# Patient Record
Sex: Male | Born: 2000 | Race: Black or African American | Hispanic: No | Marital: Single | Smoking: Never smoker
Health system: Southern US, Community
[De-identification: ages and names within clinical notes are randomized; demographics above are authoritative.]

---
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
## 2019-08-20 ENCOUNTER — Encounter (HOSPITAL_COMMUNITY): Payer: Self-pay | Admitting: Emergency Medicine

## 2019-08-20 ENCOUNTER — Emergency Department (HOSPITAL_COMMUNITY)
Admission: EM | Admit: 2019-08-20 | Discharge: 2019-08-20 | Disposition: A | Discharge status: Home / Self Care | Payer: Medicaid Other | Attending: Pediatric Emergency Medicine | Admitting: Pediatric Emergency Medicine

## 2019-08-20 ENCOUNTER — Other Ambulatory Visit: Payer: Self-pay

## 2019-08-20 DIAGNOSIS — Z202 Contact with and (suspected) exposure to infections with a predominantly sexual mode of transmission: Secondary | ICD-10-CM | POA: Diagnosis not present

## 2019-08-20 DIAGNOSIS — R369 Urethral discharge, unspecified: Secondary | ICD-10-CM | POA: Diagnosis present

## 2019-08-20 DIAGNOSIS — Z79899 Other long term (current) drug therapy: Secondary | ICD-10-CM | POA: Diagnosis not present

## 2019-08-20 LAB — URINALYSIS, ROUTINE W REFLEX MICROSCOPIC
Bilirubin Urine: NEGATIVE
Glucose, UA: NEGATIVE mg/dL
Hgb urine dipstick: NEGATIVE
Ketones, ur: NEGATIVE mg/dL
Nitrite: NEGATIVE
Protein, ur: 30 mg/dL — AB
Specific Gravity, Urine: 1.024 (ref 1.005–1.030)
WBC, UA: >50 WBC/hpf — ABNORMAL HIGH (ref 0–5)
pH: 6 (ref 5.0–8.0)

## 2019-08-20 LAB — GC/CHLAMYDIA PROBE AMP (~~LOC~~) NOT AT ARMC
Chlamydia: POSITIVE — AB
Comment: NEGATIVE
Comment: NORMAL
Neisseria Gonorrhea: POSITIVE — AB

## 2019-08-20 MED ORDER — LIDOCAINE HCL (PF) 1 % IJ SOLN
INTRAMUSCULAR | Status: AC
  Administered 2019-08-20: 1 mL
  Filled 2019-08-20: qty 5

## 2019-08-20 MED ORDER — CEFTRIAXONE SODIUM 500 MG IJ SOLR
500.0000 mg | Freq: Once | INTRAMUSCULAR | Status: AC
  Administered 2019-08-20: 500 mg via INTRAMUSCULAR
  Filled 2019-08-20: qty 500

## 2019-08-20 MED ORDER — AZITHROMYCIN 250 MG PO TABS
1000.0000 mg | ORAL_TABLET | Freq: Once | ORAL | Status: AC
  Administered 2019-08-20: 06:00:00 1000 mg via ORAL
  Filled 2019-08-20: qty 4

## 2019-08-20 NOTE — ED Notes (Signed)
Urine culture sent to lab with urine 

## 2019-08-20 NOTE — ED Triage Notes (Signed)
Patient reports yellow penile discharge for 2 weeks , denies fever or chills .

## 2019-08-20 NOTE — ED Provider Notes (Signed)
[facility] EMERGENCY DEPARTMENT Provider Note   CSN: [id number] Arrival date & time: 08/20/19  8546     History Chief Complaint  Patient presents with  . Penile Discharge    Keith Sullivan is a 19 y.o. male with STD treatment 10 month prior with 2 week of discharge.  No fevers, vomiting, diarrhea.  No medications prior.    The history is provided by the patient.  Penile Discharge This is a new problem. The current episode started more than 1 week ago. The problem occurs constantly. The problem has been gradually worsening. Pertinent negatives include no abdominal pain and no shortness of breath. Nothing aggravates the symptoms. Nothing relieves the symptoms. He has tried nothing for the symptoms.       History reviewed. No pertinent past medical history.  Patient Active Problem List   Diagnosis Date Noted  . Severe recurrent major depression without psychotic features (HCC) 09/20/2017  . Cannabis use disorder, mild, abuse     History reviewed. No pertinent surgical history.     No family history on file.  Social History   Tobacco Use  . Smoking status: Never Smoker  . Smokeless tobacco: Never Used  Substance Use Topics  . Alcohol use: Yes    Comment: socially   . Drug use: Yes    Types: Marijuana    Home Medications Prior to Admission medications   Medication Sig Start Date End Date Taking? Authorizing Provider  FLUoxetine (PROZAC) 10 MG capsule Take 1 capsule (10 mg total) by mouth daily. 09/27/17   Starkes-Perry, Juel Burrow, FNP  hydrOXYzine (ATARAX/VISTARIL) 25 MG tablet Take 1 tablet (25 mg total) by mouth at bedtime as needed and may repeat dose one time if needed for anxiety (insomnia). 09/26/17   Maryagnes Amos, FNP    Allergies    Patient has no known allergies.  Review of Systems   Review of Systems  Respiratory: Negative for shortness of breath.   Gastrointestinal: Negative for abdominal pain.  Genitourinary: Positive for  discharge and dysuria. Negative for decreased urine volume.  Skin: Negative for rash.  All other systems reviewed and are negative.   Physical Exam Updated Vital Signs Ht 6' (1.829 m)   Wt 75 kg   BMI 22.42 kg/m   Physical Exam Vitals and nursing note reviewed.  Constitutional:      Appearance: He is well-developed.  HENT:     Head: Normocephalic and atraumatic.     Nose: No congestion or rhinorrhea.     Mouth/Throat:     Pharynx: No oropharyngeal exudate.  Eyes:     Extraocular Movements: Extraocular movements intact.     Conjunctiva/sclera: Conjunctivae normal.     Pupils: Pupils are equal, round, and reactive to light.  Cardiovascular:     Rate and Rhythm: Normal rate and regular rhythm.     Heart sounds: No murmur.  Pulmonary:     Effort: Pulmonary effort is normal. No respiratory distress.     Breath sounds: Normal breath sounds.  Abdominal:     Palpations: Abdomen is soft.     Tenderness: There is no abdominal tenderness.  Genitourinary:    Penis: Normal.      Testes: Normal.     Comments: Purulent discharge from meatus Musculoskeletal:     Cervical back: Neck supple.  Skin:    General: Skin is warm and dry.     Capillary Refill: Capillary refill takes less than 2 seconds.  Neurological:  General: No focal deficit present.     Mental Status: He is alert.     Gait: Gait normal.     Deep Tendon Reflexes: Reflexes normal.     ED Results / Procedures / Treatments   Labs (all labs ordered are listed, but only abnormal results are displayed) Labs Reviewed  URINALYSIS, ROUTINE W REFLEX MICROSCOPIC - Abnormal; Notable for the following components:      Result Value   APPearance CLOUDY (*)    Protein, ur 30 (*)    Leukocytes,Ua LARGE (*)    WBC, UA >50 (*)    Bacteria, UA RARE (*)    All other components within normal limits  GC/CHLAMYDIA PROBE AMP ([place]) NOT AT [facility]    EKG None  Radiology No results found.  Procedures Procedures  (including critical care time)  Medications Ordered in ED Medications  cefTRIAXone (ROCEPHIN) injection 500 mg (has no administration in time range)  azithromycin (ZITHROMAX) tablet 1,000 mg (has no administration in time range)    ED Course  I have reviewed the triage vital signs and the nursing notes.  Pertinent labs & imaging results that were available during my care of the patient were reviewed by me and considered in my medical decision making (see chart for details).    MDM Rules/Calculators/A&P                      Patient is overall well appearing with symptoms consistent with STD.  Exam notable for penile discharge.  Normal testicles without tenderness.  No skin changes.  Doubt epididymitis, cellulitis or other serious bacterial infection at this time.    UA mucus and WBCs with large leukocytes likely 2/2 STD.  Ceftriaxone and azithro provided.    Return precautions discussed with family prior to discharge and they were advised to follow with pcp as needed if symptoms worsen or fail to improve.   Final Clinical Impression(s) / ED Diagnoses Final diagnoses:  Penile discharge    Rx / DC Orders ED Discharge Orders    None       Rohith Fauth, Lillia Carmel, MD 08/20/19 [phone]

## 2019-08-20 NOTE — ED Notes (Signed)
Discussed d/c papers with pt. Discussed s/sx to return, pcp follow up, notification process for positive labs, safe sex/contraception. Pt verbalized understanding.

## 2019-08-20 NOTE — ED Notes (Signed)
Pt still waiting on ride

## 2019-09-27 ENCOUNTER — Emergency Department (HOSPITAL_COMMUNITY)
Admission: EM | Admit: 2019-09-27 | Discharge: 2019-09-28 | Disposition: A | Discharge status: Home / Self Care | Payer: Medicaid Other | Attending: Emergency Medicine | Admitting: Emergency Medicine

## 2019-09-27 ENCOUNTER — Encounter (HOSPITAL_COMMUNITY): Payer: Self-pay | Admitting: Emergency Medicine

## 2019-09-27 DIAGNOSIS — Z5321 Procedure and treatment not carried out due to patient leaving prior to being seen by health care provider: Secondary | ICD-10-CM | POA: Diagnosis not present

## 2019-09-27 DIAGNOSIS — R300 Dysuria: Secondary | ICD-10-CM | POA: Diagnosis not present

## 2019-09-27 DIAGNOSIS — R369 Urethral discharge, unspecified: Secondary | ICD-10-CM | POA: Insufficient documentation

## 2019-09-27 LAB — URINALYSIS, ROUTINE W REFLEX MICROSCOPIC
Bilirubin Urine: NEGATIVE
Glucose, UA: NEGATIVE mg/dL
Hgb urine dipstick: NEGATIVE
Ketones, ur: NEGATIVE mg/dL
Nitrite: NEGATIVE
Protein, ur: NEGATIVE mg/dL
Specific Gravity, Urine: 1.024 (ref 1.005–1.030)
pH: 6 (ref 5.0–8.0)

## 2019-09-27 NOTE — ED Triage Notes (Signed)
Pt here for evaluation and treatment of ?STD, pt c/o dysuria with recent hx of white/yellow discharge.

## 2019-09-28 ENCOUNTER — Emergency Department (HOSPITAL_COMMUNITY)
Admission: EM | Admit: 2019-09-28 | Discharge: 2019-09-28 | Disposition: A | Discharge status: Home / Self Care | Payer: Medicaid Other | Source: Home / Self Care | Attending: Emergency Medicine | Admitting: Emergency Medicine

## 2019-09-28 ENCOUNTER — Ambulatory Visit (HOSPITAL_COMMUNITY)
Admission: EM | Admit: 2019-09-28 | Discharge: 2019-09-28 | Disposition: A | Discharge status: Home / Self Care | Payer: Medicaid Other

## 2019-09-28 ENCOUNTER — Encounter (HOSPITAL_COMMUNITY): Payer: Self-pay

## 2019-09-28 ENCOUNTER — Other Ambulatory Visit: Payer: Self-pay

## 2019-09-28 DIAGNOSIS — R369 Urethral discharge, unspecified: Secondary | ICD-10-CM

## 2019-09-28 MED ORDER — DOXYCYCLINE HYCLATE 100 MG PO CAPS: 100.0000 mg | ORAL_CAPSULE | Freq: Two times a day (BID) | ORAL | 0 refills | Status: AC

## 2019-09-28 MED ORDER — CEFTRIAXONE SODIUM 500 MG IJ SOLR
500.0000 mg | Freq: Once | INTRAMUSCULAR | Status: AC
  Administered 2019-09-28: 500 mg via INTRAMUSCULAR
  Filled 2019-09-28: qty 500

## 2019-09-28 MED ORDER — STERILE WATER FOR INJECTION IJ SOLN
INTRAMUSCULAR | Status: AC
  Administered 2019-09-28: 1 mL
  Filled 2019-09-28: qty 10

## 2019-09-28 NOTE — ED Notes (Signed)
Patient verbalizes understanding of discharge instructions. Opportunity for questioning and answers were provided. Armband removed by staff, pt discharged from ED ambulatory.   

## 2019-09-28 NOTE — ED Triage Notes (Signed)
Pt here for eval of possible STD. Pt c/o dysuria and yellow discharge.

## 2019-09-28 NOTE — ED Provider Notes (Signed)
[facility]-EMERGENCY DEPT [facility] Emergency Department Provider Note MRN:  [id number]  Arrival date & time: 09/28/19     Chief Complaint   Exposure to STD   History of Present Illness   Keith Sullivan is a 19 y.o. year-old male with no pertinent past medical presenting to the ED with chief complaint of exposure to STD.  Patient been having irritation with urinating and penile discharge for 1 week.  Denies fever, no other complaints.  He is concerned about STD.  Symptoms are mild, constant  Review of Systems  A complete 10 system review of systems was obtained and all systems are negative except as noted in the HPI and PMH.   Patient's Health History   History reviewed. No pertinent past medical history.  History reviewed. No pertinent surgical history.  No family history on file.  Social History   Socioeconomic History  . Marital status: Significant Other    Spouse name: Not on file  . Number of children: Not on file  . Years of education: Not on file  . Highest education level: Not on file  Occupational History  . Not on file  Tobacco Use  . Smoking status: Never Smoker  . Smokeless tobacco: Never Used  Vaping Use  . Vaping Use: Never used  Substance and Sexual Activity  . Alcohol use: Yes    Comment: socially   . Drug use: Yes    Types: Marijuana  . Sexual activity: Yes    Birth control/protection: Condom    Comment: at times  Other Topics Concern  . Not on file  Social History Narrative  . Not on file   Social Determinants of Health   Financial Resource Strain:   . Difficulty of Paying Living Expenses:   Food Insecurity:   . Worried About Programme researcher, broadcasting/film/video in the Last Year:   . Barista in the Last Year:   Transportation Needs:   . Freight forwarder (Medical):   Marland Kitchen Lack of Transportation (Non-Medical):   Physical Activity:   . Days of Exercise per Week:   . Minutes of Exercise per Session:   Stress:   . Feeling of Stress :   Social  Connections:   . Frequency of Communication with Friends and Family:   . Frequency of Social Gatherings with Friends and Family:   . Attends Religious Services:   . Active Member of Clubs or Organizations:   . Attends Banker Meetings:   Marland Kitchen Marital Status:   Intimate Partner Violence:   . Fear of Current or Ex-Partner:   . Emotionally Abused:   Marland Kitchen Physically Abused:   . Sexually Abused:      Physical Exam   Vitals:   09/28/19 2002 09/28/19 2002  BP:    Pulse: (!) 53   Temp:  98 F (36.7 C)  SpO2: 100%     CONSTITUTIONAL: Well-appearing, NAD NEURO:  Alert and oriented x 3, no focal deficits EYES:  eyes equal and reactive ENT/NECK:  no LAD, no JVD CARDIO: Regular rate, well-perfused, normal S1 and S2 PULM:  CTAB no wheezing or rhonchi GI/GU:  normal bowel sounds, non-distended, non-tender; normal appearing external genitalia, no lymphadenopathy, no scrotal or testicular tenderness, thin cloudy discharge from the penis MSK/SPINE:  No gross deformities, no edema SKIN:  no rash, atraumatic PSYCH:  Appropriate speech and behavior  *Additional and/or pertinent findings included in MDM below  Diagnostic and Interventional Summary    EKG Interpretation  Date/Time:    Ventricular Rate:    PR Interval:    QRS Duration:   QT Interval:    QTC Calculation:   R Axis:     Text Interpretation:        Labs Reviewed  GC/CHLAMYDIA PROBE AMP ([place]) NOT AT [facility]    No orders to display    Medications  cefTRIAXone (ROCEPHIN) injection 500 mg (has no administration in time range)     Procedures  /  Critical Care Procedures  ED Course and Medical Decision Making  I have reviewed the triage vital signs, the nursing notes, and pertinent available records from the EMR.  Listed above are laboratory and imaging tests that I personally ordered, reviewed, and interpreted and then considered in my medical decision making (see below for details).      Consistent  with uncomplicated urethritis due to STD, sending urine for GC test, treated here with 500 mg ceftriaxone intramuscularly, given prescription for Doxy.    Barth Kirks. Sedonia Small, MD [place] [email or web address]  Final Clinical Impressions(s) / ED Diagnoses     ICD-10-CM   1. Penile discharge  R36.9     ED Discharge Orders         Ordered    doxycycline (VIBRAMYCIN) 100 MG capsule  2 times daily     Discontinue  Reprint     09/28/19 2005           Discharge Instructions Discussed with and Provided to Patient:     Discharge Instructions     You were evaluated in the Emergency Department and after careful evaluation, we did not find any emergent condition requiring admission or further testing in the hospital.  Your exam/testing today was overall reassuring.  Your symptoms seem to be due to an STD.  As discussed, we recommend condoms every time.  Please take the antibiotic as directed for the next week.  Please return to the Emergency Department if you experience any worsening of your condition.  We encourage you to follow up with a primary care provider.  Thank you for allowing [place] to be a part of your care.       Maudie Flakes, MD 09/28/19 2007

## 2019-09-28 NOTE — Discharge Instructions (Addendum)
You were evaluated in the Emergency Department and after careful evaluation, we did not find any emergent condition requiring admission or further testing in the hospital.  Your exam/testing today was overall reassuring.  Your symptoms seem to be due to an STD.  As discussed, we recommend condoms every time.  Please take the antibiotic as directed for the next week.  Please return to the Emergency Department if you experience any worsening of your condition.  We encourage you to follow up with a primary care provider.  Thank you for allowing [place] to be a part of your care.

## 2019-12-30 ENCOUNTER — Encounter (HOSPITAL_COMMUNITY): Payer: Self-pay

## 2019-12-30 ENCOUNTER — Emergency Department (HOSPITAL_COMMUNITY): Payer: Medicaid Other

## 2019-12-30 ENCOUNTER — Emergency Department (HOSPITAL_COMMUNITY)
Admission: EM | Admit: 2019-12-30 | Discharge: 2019-12-30 | Disposition: A | Discharge status: Home / Self Care | Payer: Medicaid Other | Attending: Emergency Medicine | Admitting: Emergency Medicine

## 2019-12-30 DIAGNOSIS — Y929 Unspecified place or not applicable: Secondary | ICD-10-CM | POA: Insufficient documentation

## 2019-12-30 DIAGNOSIS — S41131A Puncture wound without foreign body of right upper arm, initial encounter: Secondary | ICD-10-CM

## 2019-12-30 DIAGNOSIS — W3400XA Accidental discharge from unspecified firearms or gun, initial encounter: Secondary | ICD-10-CM | POA: Insufficient documentation

## 2019-12-30 DIAGNOSIS — Y939 Activity, unspecified: Secondary | ICD-10-CM | POA: Insufficient documentation

## 2019-12-30 DIAGNOSIS — S41101A Unspecified open wound of right upper arm, initial encounter: Secondary | ICD-10-CM | POA: Diagnosis present

## 2019-12-30 DIAGNOSIS — S21131A Puncture wound without foreign body of right front wall of thorax without penetration into thoracic cavity, initial encounter: Secondary | ICD-10-CM

## 2019-12-30 DIAGNOSIS — S299XXA Unspecified injury of thorax, initial encounter: Secondary | ICD-10-CM

## 2019-12-30 DIAGNOSIS — Y999 Unspecified external cause status: Secondary | ICD-10-CM | POA: Insufficient documentation

## 2019-12-30 DIAGNOSIS — S21301A Unspecified open wound of right front wall of thorax with penetration into thoracic cavity, initial encounter: Secondary | ICD-10-CM | POA: Diagnosis not present

## 2019-12-30 LAB — COMPREHENSIVE METABOLIC PANEL
ALT: 21 U/L (ref 0–44)
AST: 34 U/L (ref 15–41)
Albumin: 4.6 g/dL (ref 3.5–5.0)
Alkaline Phosphatase: 53 U/L (ref 38–126)
Anion gap: 17 — ABNORMAL HIGH (ref 5–15)
BUN: 12 mg/dL (ref 6–20)
CO2: 19 mmol/L — ABNORMAL LOW (ref 22–32)
Calcium: 9.6 mg/dL (ref 8.9–10.3)
Chloride: 102 mmol/L (ref 98–111)
Creatinine, Ser: 1.54 mg/dL — ABNORMAL HIGH (ref 0.61–1.24)
GFR calc Af Amer: >60 mL/min (ref >60)
GFR calc non Af Amer: >60 mL/min (ref >60)
Glucose, Bld: 152 mg/dL — ABNORMAL HIGH (ref 70–99)
Potassium: 3.2 mmol/L — ABNORMAL LOW (ref 3.5–5.1)
Sodium: 138 mmol/L (ref 135–145)
Total Bilirubin: 0.9 mg/dL (ref 0.3–1.2)
Total Protein: 8 g/dL (ref 6.5–8.1)

## 2019-12-30 LAB — I-STAT CHEM 8, ED
BUN: 14 mg/dL (ref 6–20)
Calcium, Ion: 1.1 mmol/L — ABNORMAL LOW (ref 1.15–1.40)
Chloride: 102 mmol/L (ref 98–111)
Creatinine, Ser: 1.4 mg/dL — ABNORMAL HIGH (ref 0.61–1.24)
Glucose, Bld: 148 mg/dL — ABNORMAL HIGH (ref 70–99)
HCT: 43 % (ref 39.0–52.0)
Hemoglobin: 14.6 g/dL (ref 13.0–17.0)
Potassium: 3.1 mmol/L — ABNORMAL LOW (ref 3.5–5.1)
Sodium: 140 mmol/L (ref 135–145)
TCO2: 20 mmol/L — ABNORMAL LOW (ref 22–32)

## 2019-12-30 LAB — PROTIME-INR
INR: 1.1 (ref 0.8–1.2)
Prothrombin Time: 14.2 seconds (ref 11.4–15.2)

## 2019-12-30 LAB — CBC
HCT: 42.4 % (ref 39.0–52.0)
Hemoglobin: 14.1 g/dL (ref 13.0–17.0)
MCH: 31.3 pg (ref 26.0–34.0)
MCHC: 33.3 g/dL (ref 30.0–36.0)
MCV: 94.2 fL (ref 80.0–100.0)
Platelets: 178 10*3/uL (ref 150–400)
RBC: 4.5 MIL/uL (ref 4.22–5.81)
RDW: 12.3 % (ref 11.5–15.5)
WBC: 9.9 10*3/uL (ref 4.0–10.5)
nRBC: 0 % (ref 0.0–0.2)

## 2019-12-30 LAB — LACTIC ACID, PLASMA: Lactic Acid, Venous: 5.7 mmol/L (ref 0.5–1.9)

## 2019-12-30 LAB — SAMPLE TO BLOOD BANK

## 2019-12-30 LAB — ETHANOL: Alcohol, Ethyl (B): <10 mg/dL (ref <10)

## 2019-12-30 MED ORDER — IOHEXOL 300 MG/ML  SOLN
75.0000 mL | Freq: Once | INTRAMUSCULAR | Status: AC | PRN
  Administered 2019-12-30: 75 mL via INTRAVENOUS

## 2019-12-30 MED ORDER — FENTANYL CITRATE (PF) 100 MCG/2ML IJ SOLN
50.0000 ug | Freq: Once | INTRAMUSCULAR | Status: AC
  Administered 2019-12-30: 50 ug via INTRAVENOUS
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
## 2019-12-30 NOTE — Discharge Instructions (Signed)
For pain control you may take at 1000 mg of Tylenol every 8 hours scheduled.  Apply cold compress 2-3 times daily for 20 minutes for 2-3 days to assist with swelling and inflammation.

## 2019-12-31 ENCOUNTER — Encounter (HOSPITAL_COMMUNITY): Payer: Self-pay
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
## 2021-05-08 ENCOUNTER — Ambulatory Visit (HOSPITAL_COMMUNITY): Payer: Self-pay

## 2021-05-09 ENCOUNTER — Ambulatory Visit (HOSPITAL_COMMUNITY): Payer: Self-pay

## 2021-05-10 ENCOUNTER — Ambulatory Visit (HOSPITAL_COMMUNITY): Payer: Self-pay

## 2021-05-11 ENCOUNTER — Ambulatory Visit (HOSPITAL_COMMUNITY): Payer: Self-pay

## 2021-05-14 ENCOUNTER — Ambulatory Visit (HOSPITAL_COMMUNITY)
Admission: EM | Admit: 2021-05-14 | Discharge: 2021-05-14 | Disposition: A | Discharge status: Home / Self Care | Payer: Medicaid Other | Attending: Internal Medicine | Admitting: Internal Medicine

## 2021-05-14 ENCOUNTER — Encounter (HOSPITAL_COMMUNITY): Payer: Self-pay

## 2021-05-14 DIAGNOSIS — R369 Urethral discharge, unspecified: Secondary | ICD-10-CM

## 2021-05-14 MED ORDER — CEFTRIAXONE SODIUM 500 MG IJ SOLR
INTRAMUSCULAR | Status: AC
  Filled 2021-05-14: qty 500

## 2021-05-14 MED ORDER — CEFTRIAXONE SODIUM 500 MG IJ SOLR
500.0000 mg | Freq: Once | INTRAMUSCULAR | Status: AC
  Administered 2021-05-14: 500 mg via INTRAMUSCULAR

## 2021-05-14 MED ORDER — LIDOCAINE HCL (PF) 1 % IJ SOLN
INTRAMUSCULAR | Status: AC
  Filled 2021-05-14: qty 2
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
## 2021-05-14 NOTE — Discharge Instructions (Addendum)
Please abstain from sexual intercourse until lab results are available Safe sex practices advised We will call you with recommendations if labs are abnormal.

## 2021-05-15 LAB — CYTOLOGY, (ORAL, ANAL, URETHRAL) ANCILLARY ONLY
Chlamydia: POSITIVE — AB
Comment: NEGATIVE
Comment: NEGATIVE
Comment: NORMAL
Neisseria Gonorrhea: POSITIVE — AB
Trichomonas: NEGATIVE

## 2021-05-18 ENCOUNTER — Telehealth (HOSPITAL_COMMUNITY): Payer: Self-pay | Admitting: Emergency Medicine

## 2021-05-18 MED ORDER — DOXYCYCLINE HYCLATE 100 MG PO CAPS: 100.0000 mg | ORAL_CAPSULE | Freq: Two times a day (BID) | ORAL | 0 refills | Status: AC

## 2021-05-18 NOTE — ED Provider Notes (Signed)
[place]    CSN: [id number] Arrival date & time: 05/14/21  1406      History   Chief Complaint Chief Complaint  Patient presents with   Exposure to STD    HPI Tyrone Treveyon Replogle is a 21 y.o. male comes to urgent care for STD testing.  Patient was recently released from prison.  He has had sexual intercourse with a new partner.  Intercourse was unprotected.  He has been having some clear urethral discharge and dysuria on micturition.  No groin pain or scrotal swelling.  No blood in ejaculate.  No abdominal pain.Marland Kitchen   HPI  History reviewed. No pertinent past medical history.  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 No pertinent surgical history.     Home Medications    Prior to Admission medications   Medication Sig Start Date End Date Taking? Authorizing Provider  FLUoxetine (PROZAC) 10 MG capsule Take 1 capsule (10 mg total) by mouth daily. 09/27/17  Yes Starkes-Perry, Gayland Curry, FNP  hydrOXYzine (ATARAX/VISTARIL) 25 MG tablet Take 1 tablet (25 mg total) by mouth at bedtime as needed and may repeat dose one time if needed for anxiety (insomnia). 09/26/17  Yes Starkes-Perry, Gayland Curry, FNP  doxycycline (VIBRAMYCIN) 100 MG capsule Take 1 capsule (100 mg total) by mouth 2 (two) times daily for 7 days. 05/18/21 05/25/21  Chase Picket, MD    Family History History reviewed. No pertinent family history.  Social History Social History   Tobacco Use   Smoking status: Never   Smokeless tobacco: Never  Vaping Use   Vaping Use: Never used  Substance Use Topics   Alcohol use: Yes    Comment: socially    Drug use: Yes    Types: Marijuana     Allergies   Patient has no known allergies.   Review of Systems Review of Systems  Genitourinary:  Positive for dysuria, penile discharge and penile pain. Negative  for penile swelling, scrotal swelling and testicular pain.  Musculoskeletal: Negative.     Physical Exam Triage Vital Signs ED Triage Vitals  Enc Vitals Group     BP 05/14/21 1447 119/76     Pulse Rate 05/14/21 1447 (!) 55     Resp 05/14/21 1447 18     Temp 05/14/21 1447 97.9 F (36.6 C)     Temp Source 05/14/21 1447 Oral     SpO2 05/14/21 1447 100 %     Weight --      Height --      Head Circumference --      Peak Flow --      Pain Score 05/14/21 1450 4     Pain Loc --      Pain Edu? --      Excl. in [place]? --    No data found.  Updated Vital Signs BP 119/76 (BP Location: Left Arm)    Pulse (!) 55    Temp 97.9 F (36.6 C) (Oral)    Resp 18    SpO2 100%   Visual Acuity Right Eye Distance:   Left Eye Distance:   Bilateral Distance:    Right Eye Near:   Left Eye Near:    Bilateral Near:     Physical Exam Vitals and nursing note reviewed.  Constitutional:      General: He is  not in acute distress.    Appearance: He is not ill-appearing or diaphoretic.  Cardiovascular:     Rate and Rhythm: Normal rate.  Abdominal:     General: Bowel sounds are normal.     Palpations: Abdomen is soft.  Genitourinary:    Penis: Normal.      Comments: No discharge noted.  No scrotal swelling Musculoskeletal:        General: Normal range of motion.  Neurological:     Mental Status: He is alert.     UC Treatments / Results  Labs (all labs ordered are listed, but only abnormal results are displayed) Labs Reviewed  CYTOLOGY, (ORAL, ANAL, URETHRAL) ANCILLARY ONLY - Abnormal; Notable for the following components:      Result Value   Neisseria Gonorrhea Positive (*)    Chlamydia Positive (*)    All other components within normal limits    EKG   Radiology No results found.  Procedures Procedures (including critical care time)  Medications Ordered in UC Medications  cefTRIAXone (ROCEPHIN) injection 500 mg (500 mg Intramuscular Given 05/14/21 1503)    Initial Impression  / Assessment and Plan / UC Course  I have reviewed the triage vital signs and the nursing notes.  Pertinent labs & imaging results that were available during my care of the patient were reviewed by me and considered in my medical decision making (see chart for details).     1.  Penile discharge: Ceftriaxone 500 mg IM x1 dose Cytology for GC/chlamydia/trichomonas We will call patient with recommendations if labs are abnormal Return precautions given Safe sex practices advised. Final Clinical Impressions(s) / UC Diagnoses   Final diagnoses:  Penile discharge     Discharge Instructions      Please abstain from sexual intercourse until lab results are available Safe sex practices advised We will call you with recommendations if labs are abnormal.    ED Prescriptions   None    PDMP not reviewed this encounter.   Chase Picket, MD 05/18/21 1128

## 2021-07-13 ENCOUNTER — Ambulatory Visit (HOSPITAL_COMMUNITY)
Admission: EM | Admit: 2021-07-13 | Discharge: 2021-07-13 | Disposition: A | Discharge status: Home / Self Care | Payer: Medicaid Other | Attending: Internal Medicine | Admitting: Internal Medicine

## 2021-07-13 ENCOUNTER — Encounter (HOSPITAL_COMMUNITY): Payer: Self-pay

## 2021-07-13 DIAGNOSIS — R360 Urethral discharge without blood: Secondary | ICD-10-CM

## 2021-07-13 MED ORDER — DOXYCYCLINE HYCLATE 100 MG PO CAPS: 100.0000 mg | ORAL_CAPSULE | Freq: Two times a day (BID) | ORAL | 0 refills | Status: AC

## 2021-07-13 NOTE — Discharge Instructions (Addendum)
Please take medications as prescribed ?Avoid sexual intercourse for 7 days after starting the antibiotics ?This will allow the infection to be completely treated ?Return to urgent care if you have worsening symptoms ?We will call you with recommendations if labs are abnormal. ?

## 2021-07-13 NOTE — ED Triage Notes (Signed)
Pt presents with c/o penile  discharge , irritation X 2 weeks.  ? ?Pt states he was treated in January with Rocephin and states he never picked up antibiotics.  ? ?Pt states he has had new partners.  ? ? ?

## 2021-07-14 NOTE — ED Provider Notes (Signed)
?[facility] ? ? ? ?CSN: [id number] ?Arrival date & time: 07/13/21  1357 ? ? ?  ? ?History   ?Chief Complaint ?Chief Complaint  ?Patient presents with  ? SEXUALLY TRANSMITTED DISEASE  ? ? ?HPI ?Keith Sullivan is a 21 y.o. male comes for STD screening.  Patient was diagnosed with gonorrhea and chlamydia on 05/14/21.  Patient was treated for gonorrhea but did not return to the clinic for treatment of chlamydia.  The clinic staff could not reach the patient to send medications for chlamydia.  Patient comes to the urgent care with clear urethral discharge with no dysuria urgency or frequency.  No scrotal or groin pain.  No penile ulcerations. ? ?HPI ? ?History reviewed. No pertinent past medical history. ? ?Patient Active Problem List  ? Diagnosis Date Noted  ? 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 Cannabis use disorder, mild, abuse   ? ? ?History reviewed. No pertinent surgical history. ? ? ? ? ?Home Medications   ? ?Prior to Admission medications   ?Medication Sig Start Date End Date Taking? Authorizing Provider  ?doxycycline (VIBRAMYCIN) 100 MG capsule Take 1 capsule (100 mg total) by mouth 2 (two) times daily for 7 days. 07/13/21 07/20/21 Yes Trevyn Lumpkin, Myrene Galas, MD  ?FLUoxetine (PROZAC) 10 MG capsule Take 1 capsule (10 mg total) by mouth daily. 09/27/17   Suella Broad, FNP  ?hydrOXYzine (ATARAX/VISTARIL) 25 MG tablet Take 1 tablet (25 mg total) by mouth at bedtime as needed and may repeat dose one time if needed for anxiety (insomnia). 09/26/17   Suella Broad, FNP  ? ? ?Family History ?History reviewed. No pertinent family history. ? ?Social History ?Social History  ? ?Tobacco Use  ? Smoking status: Never  ? Smokeless tobacco: Never  ?Vaping Use  ? Vaping Use: Never used  ?Substance Use Topics  ? Alcohol use: Yes  ?  Comment: socially   ? Drug use: Yes  ?  Types: Marijuana  ? ? ? ?Allergies   ?Patient has no known allergies. ? ? ?Review of  Systems ?Review of Systems ?As per HPI ? ?Physical Exam ?Triage Vital Signs ?ED Triage Vitals  ?Enc Vitals Group  ?   BP 07/13/21 1502 111/72  ?   Pulse Rate 07/13/21 1502 (!) 57  ?   Resp 07/13/21 1503 17  ?   Temp 07/13/21 1502 98.1 ?F (36.7 ?C)  ?   Temp Source 07/13/21 1502 Oral  ?   SpO2 --   ?   Weight --   ?   Height --   ?   Head Circumference --   ?   Peak Flow --   ?   Pain Score 07/13/21 1501 0  ?   Pain Loc --   ?   Pain Edu? --   ?   Excl. in [place]? --   ? ?No data found. ? ?Updated Vital Signs ?BP 111/72 (BP Location: Right Arm)   Pulse (!) 57   Temp 98.1 ?F (36.7 ?C) (Oral)   Resp 17  ? ?Visual Acuity ?Right Eye Distance:   ?Left Eye Distance:   ?Bilateral Distance:   ? ?Right Eye Near:   ?Left Eye Near:    ?Bilateral Near:    ? ?Physical Exam ?Vitals and nursing note reviewed.  ?Constitutional:   ?   General: He is not in acute distress. ?   Appearance: He is not ill-appearing.  ?Cardiovascular:  ?   Rate  and Rhythm: Normal rate and regular rhythm.  ?   Pulses: Normal pulses.  ?   Heart sounds: Normal heart sounds.  ?Pulmonary:  ?   Effort: Pulmonary effort is normal.  ?   Breath sounds: Normal breath sounds.  ?Neurological:  ?   Mental Status: He is alert.  ? ? ? ?UC Treatments / Results  ?Labs ?(all labs ordered are listed, but only abnormal results are displayed) ?Labs Reviewed  ?CYTOLOGY, (ORAL, ANAL, URETHRAL) ANCILLARY ONLY  ? ? ?EKG ? ? ?Radiology ?No results found. ? ?Procedures ?Procedures (including critical care time) ? ?Medications Ordered in UC ?Medications - No data to display ? ?Initial Impression / Assessment and Plan / UC Course  ?I have reviewed the triage vital signs and the nursing notes. ? ?Pertinent labs & imaging results that were available during my care of the patient were reviewed by me and considered in my medical decision making (see chart for details). ? ?  ? ?1.  Penile discharge: ?Cytology for GC/chlamydia/trichomonas ?We will call patient with recommendations if labs  are abnormal ?Safe sex practices recommended ?Patient is advised to avoid sexual intercourse until lab results are available. ?Final Clinical Impressions(s) / UC Diagnoses  ? ?Final diagnoses:  ?Penile discharge, without blood  ? ? ? ?Discharge Instructions   ? ?  ?Please take medications as prescribed ?Avoid sexual intercourse for 7 days after starting the antibiotics ?This will allow the infection to be completely treated ?Return to urgent care if you have worsening symptoms ?We will call you with recommendations if labs are abnormal. ? ? ?ED Prescriptions   ? ? Medication Sig Dispense Auth. Provider  ? doxycycline (VIBRAMYCIN) 100 MG capsule Take 1 capsule (100 mg total) by mouth 2 (two) times daily for 7 days. 14 capsule Ellison Leisure, Myrene Galas, MD  ? ?  ? ?PDMP not reviewed this encounter. ?  ?Chase Picket, MD ?07/14/21 1707 ? ?

## 2021-07-15 LAB — CYTOLOGY, (ORAL, ANAL, URETHRAL) ANCILLARY ONLY
Chlamydia: POSITIVE — AB
Comment: NEGATIVE
Comment: NEGATIVE
Comment: NORMAL
Neisseria Gonorrhea: POSITIVE — AB
Trichomonas: POSITIVE — AB

## 2021-07-16 ENCOUNTER — Telehealth (HOSPITAL_COMMUNITY): Payer: Self-pay | Admitting: Emergency Medicine

## 2021-07-16 MED ORDER — METRONIDAZOLE 500 MG PO TABS: 2000.0000 mg | ORAL_TABLET | Freq: Once | ORAL | 0 refills | Status: AC

## 2021-07-16 NOTE — Telephone Encounter (Signed)
Patient treated at time of visit with Doxycycline.  Will also need treatment with IM Rocephin 500mg  for positive Gonorhrea.  And needs treatment with Flagyl.   ?Attempted to reach patient x 1, the only number on chart is for girlfriend who states she relayed her results to him so he should know.  Encouraged her to have him call me back so we can review, she verbalized understanding.   ?[facility] notified.   ?Prescription sent to pharmacy on file ?

## 2021-08-25 ENCOUNTER — Encounter (HOSPITAL_COMMUNITY): Payer: Self-pay

## 2021-08-25 ENCOUNTER — Ambulatory Visit (HOSPITAL_COMMUNITY)
Admission: EM | Admit: 2021-08-25 | Discharge: 2021-08-25 | Disposition: A | Discharge status: Home / Self Care | Payer: Medicaid Other | Attending: Emergency Medicine | Admitting: Emergency Medicine

## 2021-08-25 DIAGNOSIS — R369 Urethral discharge, unspecified: Secondary | ICD-10-CM | POA: Diagnosis present

## 2021-08-25 DIAGNOSIS — Z202 Contact with and (suspected) exposure to infections with a predominantly sexual mode of transmission: Secondary | ICD-10-CM | POA: Diagnosis present

## 2021-08-25 MED ORDER — CEFTRIAXONE SODIUM 500 MG IJ SOLR
500.0000 mg | Freq: Once | INTRAMUSCULAR | Status: AC
  Administered 2021-08-25: 500 mg via INTRAMUSCULAR

## 2021-08-25 MED ORDER — CEFTRIAXONE SODIUM 500 MG IJ SOLR
INTRAMUSCULAR | Status: AC
  Filled 2021-08-25: qty 500

## 2021-08-25 MED ORDER — METRONIDAZOLE 500 MG PO TABS: 2000.0000 mg | ORAL_TABLET | Freq: Once | ORAL | 0 refills | Status: AC

## 2021-08-25 MED ORDER — LIDOCAINE HCL (PF) 1 % IJ SOLN
INTRAMUSCULAR | Status: AC
  Filled 2021-08-25: qty 2

## 2021-08-25 MED ORDER — DOXYCYCLINE HYCLATE 100 MG PO CAPS: 100.0000 mg | ORAL_CAPSULE | Freq: Two times a day (BID) | ORAL | 0 refills | Status: AC

## 2021-08-25 NOTE — Discharge Instructions (Addendum)
Labs pending 2-3 days, you will be notified of positive test results only ? ?As you have known exposure to trichomoniasis and gonorrhea you have received treatment today you will also be treated for chlamydia today ? ?Take doxycycline twice daily for 7 days ? ?Take all 4 metronidazole tablets at once, do not drink alcohol for 24 to 48 hours after use or it will make you sick ? ?Please refrain from having sex until labs results, all treatment is complete and symptoms have resolved ? ?Please notify any person that you have slept with since 07/18/2021 to be tested and received treatment ? ?Moving forward, it is recommended you use some form of protection against the transmission of sti infections  such as condoms or dental dams with each sexual encounter  ? ?

## 2021-08-25 NOTE — ED Triage Notes (Signed)
Pt presents with recurrent penile discharge. ?

## 2021-08-25 NOTE — ED Provider Notes (Signed)
?[facility] ? ? ? ?CSN: [id number] ?Arrival date & time: 08/25/21  1617 ? ? ?  ? ?History   ?Chief Complaint ?Chief Complaint  ?Patient presents with  ? Penile Discharge  ? ? ?HPI ?Keith Sullivan is a 21 y.o. male.  ? ?Patient presents with dysuria, yellow penile discharge for 4 days.  Sexually active, 5 partners within the last 2 months, sometimes condom use.  No known exposure to trichomoniasis and gonorrhea.,  Denies hematuria, penile or testicle swelling, flank pain, abdominal pain, new rash or lesions, fever or chills. ? ?History reviewed. No pertinent past medical history. ? ?Patient Active Problem List  ? Diagnosis Date Noted  ? 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 Cannabis use disorder, mild, abuse   ? ? ?History reviewed. No pertinent surgical history. ? ? ? ? ?Home Medications   ? ?Prior to Admission medications   ?Medication Sig Start Date End Date Taking? Authorizing Provider  ?FLUoxetine (PROZAC) 10 MG capsule Take 1 capsule (10 mg total) by mouth daily. 09/27/17   Suella Broad, FNP  ?hydrOXYzine (ATARAX/VISTARIL) 25 MG tablet Take 1 tablet (25 mg total) by mouth at bedtime as needed and may repeat dose one time if needed for anxiety (insomnia). 09/26/17   Suella Broad, FNP  ? ? ?Family History ?History reviewed. No pertinent family history. ? ?Social History ?Social History  ? ?Tobacco Use  ? Smoking status: Never  ? Smokeless tobacco: Never  ?Vaping Use  ? Vaping Use: Never used  ?Substance Use Topics  ? Alcohol use: Yes  ?  Comment: socially   ? Drug use: Yes  ?  Types: Marijuana  ? ? ? ?Allergies   ?Patient has no known allergies. ? ? ?Review of Systems ?Review of Systems  ?Constitutional: Negative.   ?Genitourinary:  Positive for dysuria and penile discharge. Negative for decreased urine volume, difficulty urinating, enuresis, flank pain, frequency, genital sores, hematuria, penile pain, penile swelling, scrotal  swelling, testicular pain and urgency.  ?Skin: Negative.   ?Neurological: Negative.   ? ? ?Physical Exam ?Triage Vital Signs ?ED Triage Vitals  ?Enc Vitals Group  ?   BP 08/25/21 1659 116/78  ?   Pulse Rate 08/25/21 1659 (!) 57  ?   Resp 08/25/21 1659 18  ?   Temp 08/25/21 1659 98.7 ?F (37.1 ?C)  ?   Temp Source 08/25/21 1659 Oral  ?   SpO2 08/25/21 1659 98 %  ?   Weight --   ?   Height --   ?   Head Circumference --   ?   Peak Flow --   ?   Pain Score 08/25/21 1701 5  ?   Pain Loc --   ?   Pain Edu? --   ?   Excl. in [place]? --   ? ?No data found. ? ?Updated Vital Signs ?BP 116/78 (BP Location: Right Arm)   Pulse (!) 57   Temp 98.7 ?F (37.1 ?C) (Oral)   Resp 18   SpO2 98%  ? ?Visual Acuity ?Right Eye Distance:   ?Left Eye Distance:   ?Bilateral Distance:   ? ?Right Eye Near:   ?Left Eye Near:    ?Bilateral Near:    ? ?Physical Exam ?Constitutional:   ?   Appearance: Normal appearance.  ?HENT:  ?   Head: Normocephalic.  ?Eyes:  ?   Extraocular Movements: Extraocular movements intact.  ?Pulmonary:  ?   Effort: Pulmonary  effort is normal.  ?Genitourinary: ?   Comments: deferred ?Skin: ?   General: Skin is warm and dry.  ?Neurological:  ?   Mental Status: He is alert and oriented to person, place, and time. Mental status is at baseline.  ?Psychiatric:     ?   Mood and Affect: Mood normal.     ?   Behavior: Behavior normal.  ? ? ? ?UC Treatments / Results  ?Labs ?(all labs ordered are listed, but only abnormal results are displayed) ?Labs Reviewed  ?CYTOLOGY, (ORAL, ANAL, URETHRAL) ANCILLARY ONLY  ? ? ?EKG ? ? ?Radiology ?No results found. ? ?Procedures ?Procedures (including critical care time) ? ?Medications Ordered in UC ?Medications - No data to display ? ?Initial Impression / Assessment and Plan / UC Course  ?I have reviewed the triage vital signs and the nursing notes. ? ?Pertinent labs & imaging results that were available during my care of the patient were reviewed by me and considered in my medical decision  making (see chart for details). ? ?Penile discharge ?Exposure to trichomoniasis ?Exposure to gonorrhea ? ?STI labs pending, will treat prophylactically for all 3 infections as during the last 2 visits staff has had difficulty contacting patient for treatment, metronidazole and doxycycline sent to pharmacy, discussed administration, Rocephin injection given in office for gonorrhea, advised abstinence until lab results treatment is complete and symptoms have resolved, advised patient to notify all 5 partners so that they may be tested and treated, discussed chances of rexposure, advised condom use during all sexual encounters moving forward, may follow-up as needed ?Final Clinical Impressions(s) / UC Diagnoses  ? ?Final diagnoses:  ?None  ? ?Discharge Instructions   ?None ?  ? ?ED Prescriptions   ?None ?  ? ?PDMP not reviewed this encounter. ?  ?Hans Eden, NP ?08/25/21 1722 ? ?

## 2021-08-26 LAB — CYTOLOGY, (ORAL, ANAL, URETHRAL) ANCILLARY ONLY
Chlamydia: NEGATIVE
Comment: NEGATIVE
Comment: NEGATIVE
Comment: NORMAL
Neisseria Gonorrhea: POSITIVE — AB
Trichomonas: NEGATIVE
